# Patient Record
Sex: Female | Born: 1950 | Race: White | Hispanic: No | Smoking: Former smoker
Health system: Southern US, Community
[De-identification: ages and names within clinical notes are randomized; demographics above are authoritative.]

## PROBLEM LIST (undated history)

## (undated) DIAGNOSIS — M810 Age-related osteoporosis without current pathological fracture: Secondary | ICD-10-CM

## (undated) HISTORY — PX: SKIN CANCER EXCISION: SHX779

---

## 1998-08-31 ENCOUNTER — Other Ambulatory Visit: Admission: RE | Admit: 1998-08-31 | Discharge: 1998-08-31 | Payer: Self-pay | Admitting: Obstetrics and Gynecology

## 1999-10-10 ENCOUNTER — Encounter: Payer: Self-pay | Admitting: Obstetrics and Gynecology

## 1999-10-10 ENCOUNTER — Encounter: Admission: RE | Admit: 1999-10-10 | Discharge: 1999-10-10 | Payer: Self-pay | Admitting: Obstetrics and Gynecology

## 1999-12-05 ENCOUNTER — Other Ambulatory Visit: Admission: RE | Admit: 1999-12-05 | Discharge: 1999-12-05 | Payer: Self-pay | Admitting: Obstetrics and Gynecology

## 1999-12-06 ENCOUNTER — Other Ambulatory Visit: Admission: RE | Admit: 1999-12-06 | Discharge: 1999-12-06 | Payer: Self-pay | Admitting: Obstetrics and Gynecology

## 1999-12-06 ENCOUNTER — Encounter (INDEPENDENT_AMBULATORY_CARE_PROVIDER_SITE_OTHER): Payer: Self-pay

## 2000-10-10 ENCOUNTER — Encounter: Payer: Self-pay | Admitting: Obstetrics and Gynecology

## 2000-10-10 ENCOUNTER — Encounter: Admission: RE | Admit: 2000-10-10 | Discharge: 2000-10-10 | Payer: Self-pay | Admitting: Obstetrics and Gynecology

## 2001-01-15 ENCOUNTER — Other Ambulatory Visit: Admission: RE | Admit: 2001-01-15 | Discharge: 2001-01-15 | Payer: Self-pay | Admitting: Obstetrics and Gynecology

## 2001-10-15 ENCOUNTER — Encounter: Payer: Self-pay | Admitting: Obstetrics and Gynecology

## 2001-10-15 ENCOUNTER — Encounter: Admission: RE | Admit: 2001-10-15 | Discharge: 2001-10-15 | Payer: Self-pay | Admitting: Obstetrics and Gynecology

## 2002-01-16 ENCOUNTER — Other Ambulatory Visit: Admission: RE | Admit: 2002-01-16 | Discharge: 2002-01-16 | Payer: Self-pay | Admitting: Obstetrics and Gynecology

## 2002-03-05 ENCOUNTER — Ambulatory Visit (HOSPITAL_COMMUNITY): Admission: RE | Admit: 2002-03-05 | Discharge: 2002-03-05 | Payer: Self-pay | Admitting: Obstetrics and Gynecology

## 2002-03-05 ENCOUNTER — Encounter (INDEPENDENT_AMBULATORY_CARE_PROVIDER_SITE_OTHER): Payer: Self-pay

## 2002-11-05 ENCOUNTER — Ambulatory Visit (HOSPITAL_COMMUNITY): Admission: RE | Admit: 2002-11-05 | Discharge: 2002-11-05 | Payer: Self-pay | Admitting: Obstetrics and Gynecology

## 2002-11-05 ENCOUNTER — Encounter: Payer: Self-pay | Admitting: Obstetrics and Gynecology

## 2002-11-10 ENCOUNTER — Encounter: Admission: RE | Admit: 2002-11-10 | Discharge: 2002-11-10 | Payer: Self-pay | Admitting: Obstetrics and Gynecology

## 2002-11-10 ENCOUNTER — Encounter: Payer: Self-pay | Admitting: Obstetrics and Gynecology

## 2003-11-16 ENCOUNTER — Ambulatory Visit (HOSPITAL_COMMUNITY): Admission: RE | Admit: 2003-11-16 | Discharge: 2003-11-16 | Payer: Self-pay | Admitting: Obstetrics and Gynecology

## 2004-11-29 ENCOUNTER — Ambulatory Visit (HOSPITAL_COMMUNITY): Admission: RE | Admit: 2004-11-29 | Discharge: 2004-11-29 | Payer: Self-pay | Admitting: Obstetrics and Gynecology

## 2005-12-12 ENCOUNTER — Ambulatory Visit (HOSPITAL_COMMUNITY): Admission: RE | Admit: 2005-12-12 | Discharge: 2005-12-12 | Payer: Self-pay | Admitting: Obstetrics and Gynecology

## 2005-12-28 ENCOUNTER — Encounter: Admission: RE | Admit: 2005-12-28 | Discharge: 2005-12-28 | Payer: Self-pay | Admitting: Obstetrics and Gynecology

## 2007-01-16 ENCOUNTER — Ambulatory Visit (HOSPITAL_COMMUNITY): Admission: RE | Admit: 2007-01-16 | Discharge: 2007-01-16 | Payer: Self-pay | Admitting: Internal Medicine

## 2008-01-20 ENCOUNTER — Ambulatory Visit (HOSPITAL_COMMUNITY): Admission: RE | Admit: 2008-01-20 | Discharge: 2008-01-20 | Payer: Self-pay | Admitting: Obstetrics and Gynecology

## 2009-02-16 ENCOUNTER — Ambulatory Visit (HOSPITAL_COMMUNITY): Admission: RE | Admit: 2009-02-16 | Discharge: 2009-02-16 | Payer: Self-pay | Admitting: Internal Medicine

## 2009-03-15 ENCOUNTER — Encounter (INDEPENDENT_AMBULATORY_CARE_PROVIDER_SITE_OTHER): Payer: Self-pay | Admitting: *Deleted

## 2009-05-12 ENCOUNTER — Ambulatory Visit: Payer: Self-pay | Admitting: Internal Medicine

## 2009-10-31 ENCOUNTER — Telehealth: Payer: Self-pay | Admitting: Internal Medicine

## 2010-03-02 ENCOUNTER — Ambulatory Visit (HOSPITAL_COMMUNITY): Admission: RE | Admit: 2010-03-02 | Discharge: 2010-03-02 | Payer: Self-pay | Admitting: Obstetrics and Gynecology

## 2010-10-15 ENCOUNTER — Encounter: Payer: Self-pay | Admitting: Obstetrics and Gynecology

## 2010-10-16 ENCOUNTER — Encounter: Payer: Self-pay | Admitting: Internal Medicine

## 2010-10-24 NOTE — Progress Notes (Signed)
Summary: CHANGED GI CARE FROM   Phone Note Outgoing Call   Call placed by: Hortense Ramal CMA Duncan Dull),  October 31, 2009 12:27 PM Call placed to: Patient Summary of Call: Placed call to patient to advise her that she is due for colonoscopy. She states that she has changed GI care due to insurance coverage. Initial call taken by: Hortense Ramal CMA Duncan Dull),  October 31, 2009 12:28 PM

## 2011-02-09 NOTE — Op Note (Signed)
[facility]  Patient:    Sherry Hawkins, Sherry Hawkins Visit Number: [id number] MRN: [id number]          Service Type: DSU Location: DAY Attending Physician:  Malon Kindle Dictated by:   Malachi Pro. Ambrose Mantle, M.D. Proc. Date: 03/05/02 Admit Date:  03/05/2002 Discharge Date: 03/05/2002                             Operative Report  PREOPERATIVE DIAGNOSIS:  Persistent abnormal uterine bleeding in spite of medical therapy.  POSTOPERATIVE DIAGNOSES:  Persistent abnormal uterine bleeding in spite of medical therapy. Small fibroid and endometrial polyp.  OPERATION PERFORMED:  D&C, hysteroscopy, removal of endometrial polyp.  SURGEON:  Malachi Pro. Ambrose Mantle, M.D.  ANESTHESIA:  General.  DESCRIPTION OF PROCEDURE:  The patient was placed under satisfactory general anesthesia and placed in lithotomy position in the [place] stirrups. The vulva, vagina and perineum were prepped with Betadine solution and draped as a sterile field. Exam revealed the uterus to be anterior normal size. There was a probably 2 cm fibroid in the mid body of the uterus anteriorly. The adnexa felt free. The area was draped as a sterile field. The cervix was drawn into the operative field and the uterus sounded to 8 cm slightly anteriorly. It was then dilated to a #20 Hanks dilator and the hysteroscope was inserted. Because of bleeding from the endometrial cavity, I never could get a decent view of the endometrial cavity. I tried removing the scope 2 or 3 times but never was able to get a good view. I then did an endocervical curettage which reduced minimal tissue and did an endometrial curettage, produced a significant amount of endometrial tissue and then with the polyp forceps I was able to feel the polyp in the right cornual area which I did remove in several pieces. The total volume of it was about a cm2. I then reinserted the hysteroscope, could get a decent view now and saw no polypoid  tissue remaining in the uterus. Overall, the quality of the hysteroscopy visualization was much less than satisfactory. At this point, the procedure was terminated, blood loss was less than 10 cc. The patient returned to recovery in satisfactory condition. Dictated by:   Malachi Pro. Ambrose Mantle, M.D. Attending Physician:  Malon Kindle DD:  03/05/02 TD:  03/07/02 Job: [id number]

## 2011-03-05 ENCOUNTER — Other Ambulatory Visit (HOSPITAL_COMMUNITY): Payer: Self-pay | Admitting: Obstetrics and Gynecology

## 2011-03-05 DIAGNOSIS — Z1231 Encounter for screening mammogram for malignant neoplasm of breast: Secondary | ICD-10-CM

## 2011-03-14 ENCOUNTER — Ambulatory Visit (HOSPITAL_COMMUNITY)
Admission: RE | Admit: 2011-03-14 | Discharge: 2011-03-14 | Disposition: A | Discharge status: Home / Self Care | Payer: BC Managed Care – PPO | Source: Ambulatory Visit | Attending: Obstetrics and Gynecology | Admitting: Obstetrics and Gynecology

## 2011-03-14 DIAGNOSIS — Z1231 Encounter for screening mammogram for malignant neoplasm of breast: Secondary | ICD-10-CM | POA: Insufficient documentation

## 2012-03-25 ENCOUNTER — Other Ambulatory Visit (HOSPITAL_COMMUNITY): Payer: Self-pay | Admitting: Internal Medicine

## 2012-03-25 DIAGNOSIS — Z1231 Encounter for screening mammogram for malignant neoplasm of breast: Secondary | ICD-10-CM

## 2012-04-17 ENCOUNTER — Ambulatory Visit (HOSPITAL_COMMUNITY): Payer: BC Managed Care – PPO

## 2012-05-06 ENCOUNTER — Ambulatory Visit (HOSPITAL_COMMUNITY)
Admission: RE | Admit: 2012-05-06 | Discharge: 2012-05-06 | Disposition: A | Discharge status: Home / Self Care | Payer: BC Managed Care – PPO | Source: Ambulatory Visit | Attending: Internal Medicine | Admitting: Internal Medicine

## 2012-05-06 DIAGNOSIS — Z1231 Encounter for screening mammogram for malignant neoplasm of breast: Secondary | ICD-10-CM | POA: Insufficient documentation

## 2013-02-23 ENCOUNTER — Other Ambulatory Visit: Payer: Self-pay | Admitting: Dermatology

## 2013-04-21 ENCOUNTER — Other Ambulatory Visit (HOSPITAL_COMMUNITY): Payer: Self-pay | Admitting: Internal Medicine

## 2013-04-21 DIAGNOSIS — Z1231 Encounter for screening mammogram for malignant neoplasm of breast: Secondary | ICD-10-CM

## 2013-05-07 ENCOUNTER — Ambulatory Visit (HOSPITAL_COMMUNITY)
Admission: RE | Admit: 2013-05-07 | Discharge: 2013-05-07 | Disposition: A | Discharge status: Home / Self Care | Payer: BC Managed Care – PPO | Source: Ambulatory Visit | Attending: Internal Medicine | Admitting: Internal Medicine

## 2013-05-07 DIAGNOSIS — Z1231 Encounter for screening mammogram for malignant neoplasm of breast: Secondary | ICD-10-CM

## 2013-08-18 ENCOUNTER — Encounter (HOSPITAL_COMMUNITY): Payer: Self-pay

## 2013-08-18 ENCOUNTER — Other Ambulatory Visit (HOSPITAL_COMMUNITY): Payer: Self-pay | Admitting: Internal Medicine

## 2013-08-18 ENCOUNTER — Ambulatory Visit (HOSPITAL_COMMUNITY)
Admission: RE | Admit: 2013-08-18 | Discharge: 2013-08-18 | Disposition: A | Discharge status: Home / Self Care | Payer: BC Managed Care – PPO | Source: Ambulatory Visit | Attending: Internal Medicine | Admitting: Internal Medicine

## 2013-08-18 DIAGNOSIS — M810 Age-related osteoporosis without current pathological fracture: Secondary | ICD-10-CM | POA: Insufficient documentation

## 2013-08-18 HISTORY — DX: Age-related osteoporosis without current pathological fracture: M81.0

## 2013-08-18 MED ORDER — ZOLEDRONIC ACID 5 MG/100ML IV SOLN
5.0000 mg | Freq: Once | INTRAVENOUS | Status: AC
  Administered 2013-08-18: 5 mg via INTRAVENOUS
  Filled 2013-08-18: qty 100

## 2013-08-18 MED ORDER — SODIUM CHLORIDE 0.9 % IV SOLN
Freq: Once | INTRAVENOUS | Status: AC
  Administered 2013-08-18: 15:00:00 via INTRAVENOUS

## 2014-02-24 ENCOUNTER — Other Ambulatory Visit: Payer: Self-pay | Admitting: Dermatology

## 2014-04-26 ENCOUNTER — Other Ambulatory Visit (HOSPITAL_COMMUNITY): Payer: Self-pay | Admitting: Obstetrics and Gynecology

## 2014-04-26 DIAGNOSIS — Z1231 Encounter for screening mammogram for malignant neoplasm of breast: Secondary | ICD-10-CM

## 2014-05-13 ENCOUNTER — Ambulatory Visit (HOSPITAL_COMMUNITY)
Admission: RE | Admit: 2014-05-13 | Discharge: 2014-05-13 | Disposition: A | Discharge status: Home / Self Care | Payer: BC Managed Care – PPO | Source: Ambulatory Visit | Attending: Obstetrics and Gynecology | Admitting: Obstetrics and Gynecology

## 2014-05-13 DIAGNOSIS — Z1231 Encounter for screening mammogram for malignant neoplasm of breast: Secondary | ICD-10-CM | POA: Insufficient documentation

## 2015-05-20 ENCOUNTER — Other Ambulatory Visit (HOSPITAL_COMMUNITY): Payer: Self-pay | Admitting: Obstetrics and Gynecology

## 2015-05-20 DIAGNOSIS — Z1231 Encounter for screening mammogram for malignant neoplasm of breast: Secondary | ICD-10-CM

## 2015-05-26 ENCOUNTER — Ambulatory Visit (HOSPITAL_COMMUNITY): Payer: BC Managed Care – PPO

## 2018-07-28 ENCOUNTER — Other Ambulatory Visit: Payer: Self-pay | Admitting: Obstetrics and Gynecology

## 2018-07-28 DIAGNOSIS — R928 Other abnormal and inconclusive findings on diagnostic imaging of breast: Secondary | ICD-10-CM

## 2018-08-01 ENCOUNTER — Ambulatory Visit
Admission: RE | Admit: 2018-08-01 | Discharge: 2018-08-01 | Disposition: A | Discharge status: Home / Self Care | Payer: BC Managed Care – PPO | Source: Ambulatory Visit | Attending: Obstetrics and Gynecology | Admitting: Obstetrics and Gynecology

## 2018-08-01 ENCOUNTER — Other Ambulatory Visit: Payer: Self-pay | Admitting: Obstetrics and Gynecology

## 2018-08-01 ENCOUNTER — Ambulatory Visit
Admission: RE | Admit: 2018-08-01 | Discharge: 2018-08-01 | Disposition: A | Discharge status: Home / Self Care | Payer: Medicare Other | Source: Ambulatory Visit | Attending: Obstetrics and Gynecology | Admitting: Obstetrics and Gynecology

## 2018-08-01 DIAGNOSIS — R928 Other abnormal and inconclusive findings on diagnostic imaging of breast: Secondary | ICD-10-CM

## 2018-08-01 DIAGNOSIS — N6310 Unspecified lump in the right breast, unspecified quadrant: Secondary | ICD-10-CM

## 2019-01-30 ENCOUNTER — Ambulatory Visit
Admission: RE | Admit: 2019-01-30 | Discharge: 2019-01-30 | Disposition: A | Discharge status: Home / Self Care | Payer: Medicare Other | Source: Ambulatory Visit | Attending: Obstetrics and Gynecology | Admitting: Obstetrics and Gynecology

## 2019-01-30 ENCOUNTER — Other Ambulatory Visit: Payer: Self-pay

## 2019-01-30 DIAGNOSIS — N6310 Unspecified lump in the right breast, unspecified quadrant: Secondary | ICD-10-CM

## 2019-07-24 ENCOUNTER — Other Ambulatory Visit: Payer: Self-pay | Admitting: Obstetrics and Gynecology

## 2019-07-24 DIAGNOSIS — Z1231 Encounter for screening mammogram for malignant neoplasm of breast: Secondary | ICD-10-CM

## 2019-09-14 ENCOUNTER — Ambulatory Visit
Admission: RE | Admit: 2019-09-14 | Discharge: 2019-09-14 | Disposition: A | Discharge status: Home / Self Care | Payer: Medicare Other | Source: Ambulatory Visit | Attending: Obstetrics and Gynecology | Admitting: Obstetrics and Gynecology

## 2019-09-14 ENCOUNTER — Other Ambulatory Visit: Payer: Self-pay

## 2019-09-14 DIAGNOSIS — Z1231 Encounter for screening mammogram for malignant neoplasm of breast: Secondary | ICD-10-CM

## 2020-02-03 DIAGNOSIS — D2271 Melanocytic nevi of right lower limb, including hip: Secondary | ICD-10-CM | POA: Diagnosis not present

## 2020-02-03 DIAGNOSIS — L578 Other skin changes due to chronic exposure to nonionizing radiation: Secondary | ICD-10-CM | POA: Diagnosis not present

## 2020-02-03 DIAGNOSIS — B353 Tinea pedis: Secondary | ICD-10-CM | POA: Diagnosis not present

## 2020-02-03 DIAGNOSIS — D225 Melanocytic nevi of trunk: Secondary | ICD-10-CM | POA: Diagnosis not present

## 2020-02-03 DIAGNOSIS — Z85828 Personal history of other malignant neoplasm of skin: Secondary | ICD-10-CM | POA: Diagnosis not present

## 2020-02-03 DIAGNOSIS — Z87898 Personal history of other specified conditions: Secondary | ICD-10-CM | POA: Diagnosis not present

## 2020-02-03 DIAGNOSIS — L821 Other seborrheic keratosis: Secondary | ICD-10-CM | POA: Diagnosis not present

## 2020-02-03 DIAGNOSIS — D2272 Melanocytic nevi of left lower limb, including hip: Secondary | ICD-10-CM | POA: Diagnosis not present

## 2020-02-03 DIAGNOSIS — Z86018 Personal history of other benign neoplasm: Secondary | ICD-10-CM | POA: Diagnosis not present

## 2020-04-12 DIAGNOSIS — K635 Polyp of colon: Secondary | ICD-10-CM | POA: Diagnosis not present

## 2020-04-12 DIAGNOSIS — Z1211 Encounter for screening for malignant neoplasm of colon: Secondary | ICD-10-CM | POA: Diagnosis not present

## 2020-04-12 DIAGNOSIS — Q438 Other specified congenital malformations of intestine: Secondary | ICD-10-CM | POA: Diagnosis not present

## 2020-04-12 DIAGNOSIS — K648 Other hemorrhoids: Secondary | ICD-10-CM | POA: Diagnosis not present

## 2020-04-14 DIAGNOSIS — K635 Polyp of colon: Secondary | ICD-10-CM | POA: Diagnosis not present

## 2020-07-28 DIAGNOSIS — E785 Hyperlipidemia, unspecified: Secondary | ICD-10-CM | POA: Diagnosis not present

## 2020-07-28 DIAGNOSIS — M810 Age-related osteoporosis without current pathological fracture: Secondary | ICD-10-CM | POA: Diagnosis not present

## 2020-08-04 DIAGNOSIS — Z0000 Encounter for general adult medical examination without abnormal findings: Secondary | ICD-10-CM | POA: Diagnosis not present

## 2020-08-04 DIAGNOSIS — R82998 Other abnormal findings in urine: Secondary | ICD-10-CM | POA: Diagnosis not present

## 2020-08-29 ENCOUNTER — Other Ambulatory Visit: Payer: Self-pay | Admitting: Obstetrics and Gynecology

## 2020-08-29 DIAGNOSIS — Z1231 Encounter for screening mammogram for malignant neoplasm of breast: Secondary | ICD-10-CM

## 2020-09-15 ENCOUNTER — Ambulatory Visit
Admission: RE | Admit: 2020-09-15 | Discharge: 2020-09-15 | Disposition: A | Discharge status: Home / Self Care | Payer: Medicare PPO | Source: Ambulatory Visit | Attending: Obstetrics and Gynecology | Admitting: Obstetrics and Gynecology

## 2020-09-15 ENCOUNTER — Other Ambulatory Visit: Payer: Self-pay

## 2020-09-15 DIAGNOSIS — Z1231 Encounter for screening mammogram for malignant neoplasm of breast: Secondary | ICD-10-CM | POA: Diagnosis not present

## 2020-10-06 ENCOUNTER — Ambulatory Visit: Payer: Medicare Other

## 2021-02-08 DIAGNOSIS — D225 Melanocytic nevi of trunk: Secondary | ICD-10-CM | POA: Diagnosis not present

## 2021-02-08 DIAGNOSIS — D2271 Melanocytic nevi of right lower limb, including hip: Secondary | ICD-10-CM | POA: Diagnosis not present

## 2021-02-08 DIAGNOSIS — D2272 Melanocytic nevi of left lower limb, including hip: Secondary | ICD-10-CM | POA: Diagnosis not present

## 2021-02-08 DIAGNOSIS — L821 Other seborrheic keratosis: Secondary | ICD-10-CM | POA: Diagnosis not present

## 2021-02-08 DIAGNOSIS — L578 Other skin changes due to chronic exposure to nonionizing radiation: Secondary | ICD-10-CM | POA: Diagnosis not present

## 2021-02-08 DIAGNOSIS — Z87898 Personal history of other specified conditions: Secondary | ICD-10-CM | POA: Diagnosis not present

## 2021-02-08 DIAGNOSIS — Z86018 Personal history of other benign neoplasm: Secondary | ICD-10-CM | POA: Diagnosis not present

## 2021-02-08 DIAGNOSIS — L720 Epidermal cyst: Secondary | ICD-10-CM | POA: Diagnosis not present

## 2021-02-08 DIAGNOSIS — Z85828 Personal history of other malignant neoplasm of skin: Secondary | ICD-10-CM | POA: Diagnosis not present

## 2021-03-16 DIAGNOSIS — M810 Age-related osteoporosis without current pathological fracture: Secondary | ICD-10-CM | POA: Diagnosis not present

## 2021-06-26 DIAGNOSIS — M810 Age-related osteoporosis without current pathological fracture: Secondary | ICD-10-CM | POA: Diagnosis not present

## 2021-06-26 DIAGNOSIS — M150 Primary generalized (osteo)arthritis: Secondary | ICD-10-CM | POA: Diagnosis not present

## 2021-06-26 DIAGNOSIS — Z6821 Body mass index (BMI) 21.0-21.9, adult: Secondary | ICD-10-CM | POA: Diagnosis not present

## 2021-08-07 DIAGNOSIS — E785 Hyperlipidemia, unspecified: Secondary | ICD-10-CM | POA: Diagnosis not present

## 2021-08-07 DIAGNOSIS — M810 Age-related osteoporosis without current pathological fracture: Secondary | ICD-10-CM | POA: Diagnosis not present

## 2021-08-14 DIAGNOSIS — Z1331 Encounter for screening for depression: Secondary | ICD-10-CM | POA: Diagnosis not present

## 2021-08-14 DIAGNOSIS — M1990 Unspecified osteoarthritis, unspecified site: Secondary | ICD-10-CM | POA: Diagnosis not present

## 2021-08-14 DIAGNOSIS — Z0000 Encounter for general adult medical examination without abnormal findings: Secondary | ICD-10-CM | POA: Diagnosis not present

## 2021-08-14 DIAGNOSIS — Z1339 Encounter for screening examination for other mental health and behavioral disorders: Secondary | ICD-10-CM | POA: Diagnosis not present

## 2021-08-14 DIAGNOSIS — Z1212 Encounter for screening for malignant neoplasm of rectum: Secondary | ICD-10-CM | POA: Diagnosis not present

## 2021-08-14 DIAGNOSIS — E785 Hyperlipidemia, unspecified: Secondary | ICD-10-CM | POA: Diagnosis not present

## 2021-08-14 DIAGNOSIS — R82998 Other abnormal findings in urine: Secondary | ICD-10-CM | POA: Diagnosis not present

## 2021-08-14 DIAGNOSIS — M810 Age-related osteoporosis without current pathological fracture: Secondary | ICD-10-CM | POA: Diagnosis not present

## 2021-08-16 ENCOUNTER — Other Ambulatory Visit: Payer: Self-pay | Admitting: Obstetrics and Gynecology

## 2021-08-16 DIAGNOSIS — Z1231 Encounter for screening mammogram for malignant neoplasm of breast: Secondary | ICD-10-CM

## 2021-09-07 DIAGNOSIS — Z23 Encounter for immunization: Secondary | ICD-10-CM | POA: Diagnosis not present

## 2021-09-07 DIAGNOSIS — L988 Other specified disorders of the skin and subcutaneous tissue: Secondary | ICD-10-CM | POA: Diagnosis not present

## 2021-09-07 DIAGNOSIS — D485 Neoplasm of uncertain behavior of skin: Secondary | ICD-10-CM | POA: Diagnosis not present

## 2021-09-07 DIAGNOSIS — L739 Follicular disorder, unspecified: Secondary | ICD-10-CM | POA: Diagnosis not present

## 2021-09-07 DIAGNOSIS — L570 Actinic keratosis: Secondary | ICD-10-CM | POA: Diagnosis not present

## 2021-09-21 ENCOUNTER — Ambulatory Visit
Admission: RE | Admit: 2021-09-21 | Discharge: 2021-09-21 | Disposition: A | Discharge status: Home / Self Care | Payer: Medicare PPO | Source: Ambulatory Visit | Attending: Obstetrics and Gynecology | Admitting: Obstetrics and Gynecology

## 2021-09-21 DIAGNOSIS — Z1231 Encounter for screening mammogram for malignant neoplasm of breast: Secondary | ICD-10-CM

## 2021-10-11 DIAGNOSIS — Z124 Encounter for screening for malignant neoplasm of cervix: Secondary | ICD-10-CM | POA: Diagnosis not present

## 2021-10-11 DIAGNOSIS — Z01419 Encounter for gynecological examination (general) (routine) without abnormal findings: Secondary | ICD-10-CM | POA: Diagnosis not present

## 2022-02-14 DIAGNOSIS — D2271 Melanocytic nevi of right lower limb, including hip: Secondary | ICD-10-CM | POA: Diagnosis not present

## 2022-02-14 DIAGNOSIS — Z87898 Personal history of other specified conditions: Secondary | ICD-10-CM | POA: Diagnosis not present

## 2022-02-14 DIAGNOSIS — L821 Other seborrheic keratosis: Secondary | ICD-10-CM | POA: Diagnosis not present

## 2022-02-14 DIAGNOSIS — L578 Other skin changes due to chronic exposure to nonionizing radiation: Secondary | ICD-10-CM | POA: Diagnosis not present

## 2022-02-14 DIAGNOSIS — L739 Follicular disorder, unspecified: Secondary | ICD-10-CM | POA: Diagnosis not present

## 2022-02-14 DIAGNOSIS — Z85828 Personal history of other malignant neoplasm of skin: Secondary | ICD-10-CM | POA: Diagnosis not present

## 2022-02-14 DIAGNOSIS — L570 Actinic keratosis: Secondary | ICD-10-CM | POA: Diagnosis not present

## 2022-02-14 DIAGNOSIS — Z86018 Personal history of other benign neoplasm: Secondary | ICD-10-CM | POA: Diagnosis not present

## 2022-02-14 DIAGNOSIS — D225 Melanocytic nevi of trunk: Secondary | ICD-10-CM | POA: Diagnosis not present

## 2022-08-19 IMAGING — MG MM DIGITAL SCREENING BILAT W/ TOMO AND CAD
6 of 10 series · 6 of 30 positions shown · non-contrast
Comparison: Previous exam(s).

CLINICAL DATA: Screening.

EXAM:
DIGITAL SCREENING BILATERAL MAMMOGRAM WITH TOMOSYNTHESIS AND CAD
TECHNIQUE: Bilateral screening digital craniocaudal and mediolateral oblique
mammograms were obtained. Bilateral screening digital breast
tomosynthesis was performed. The images were evaluated with
computer-aided detection.

[R XCCL synth-2D]
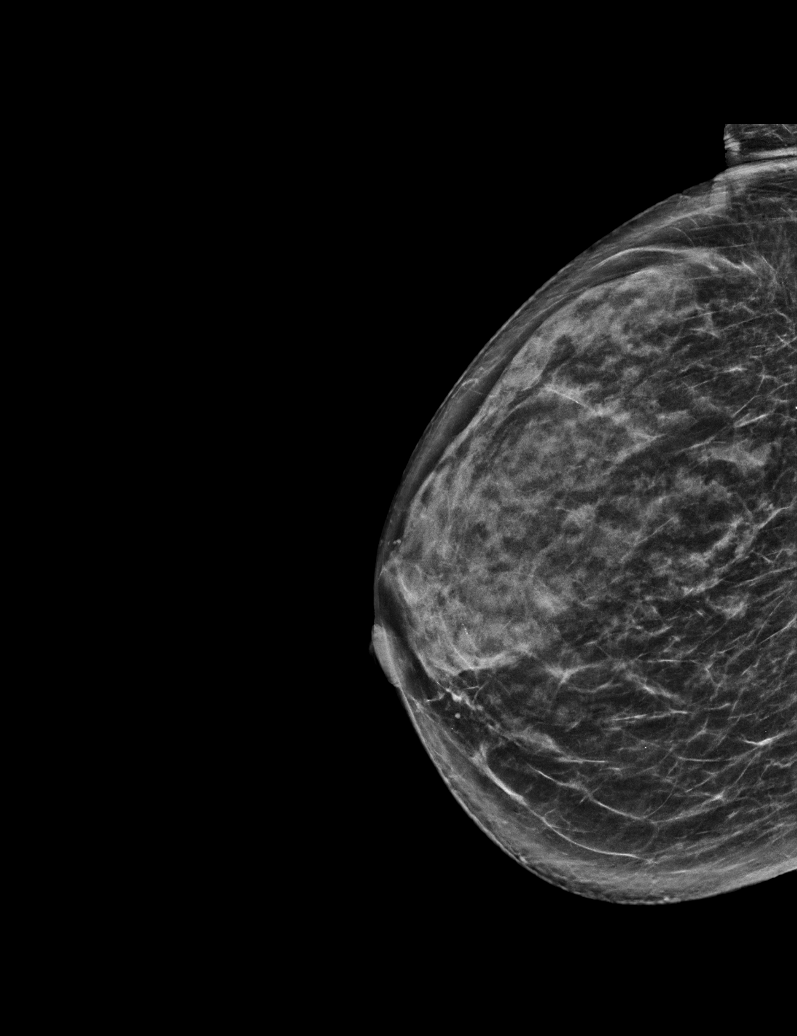

[R MLO synth-2D]
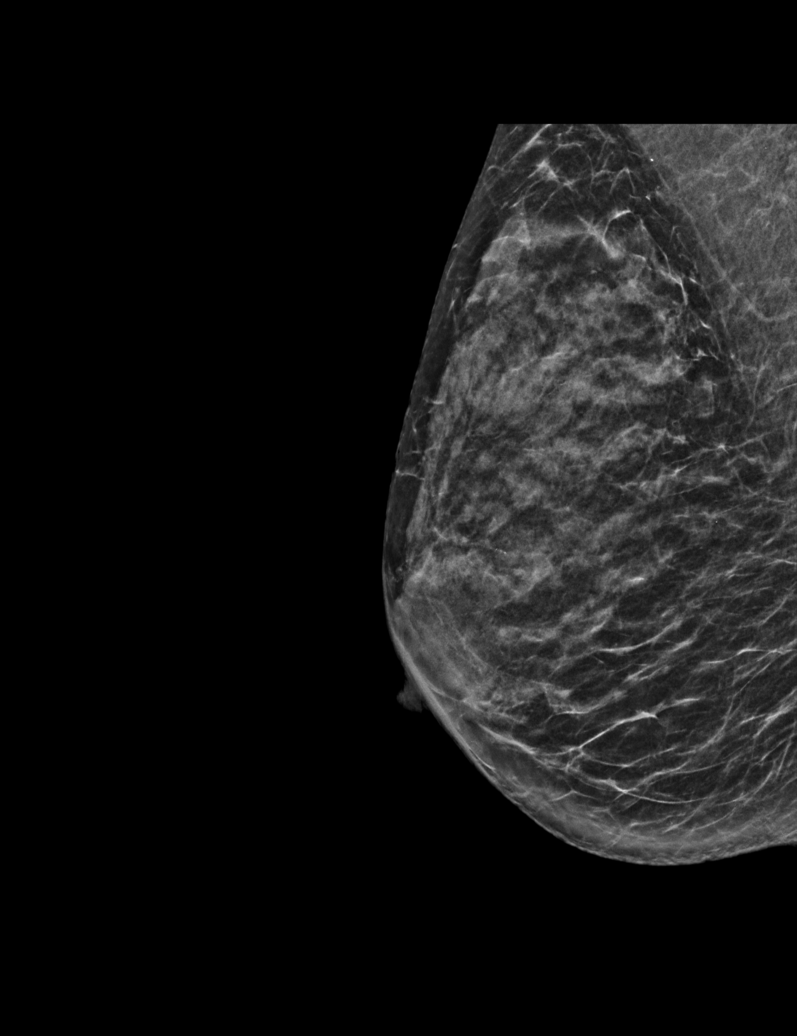

[L MLO synth-2D]
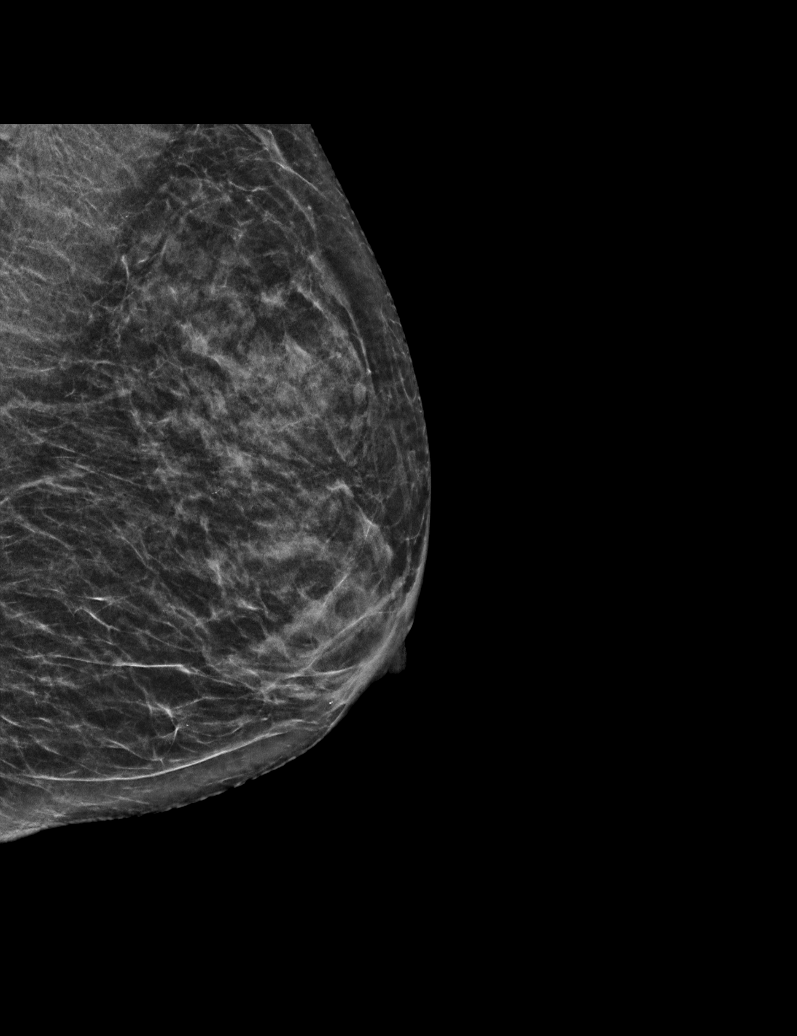

[L CC synth-2D]
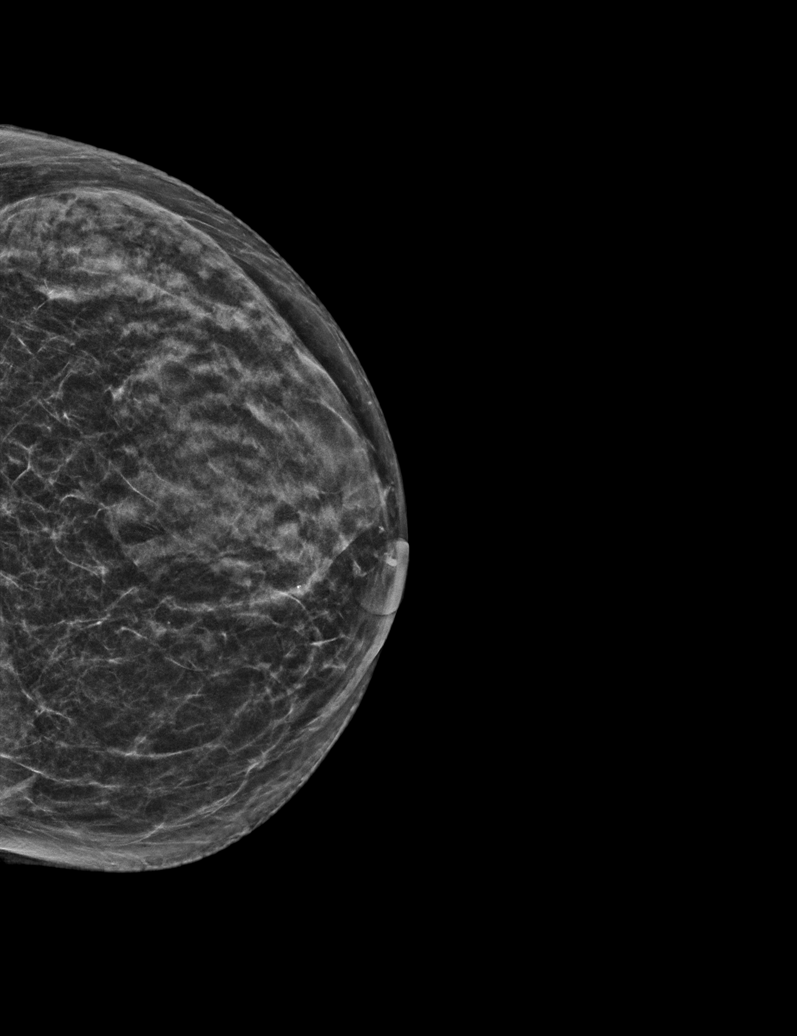

[R CC synth-2D]
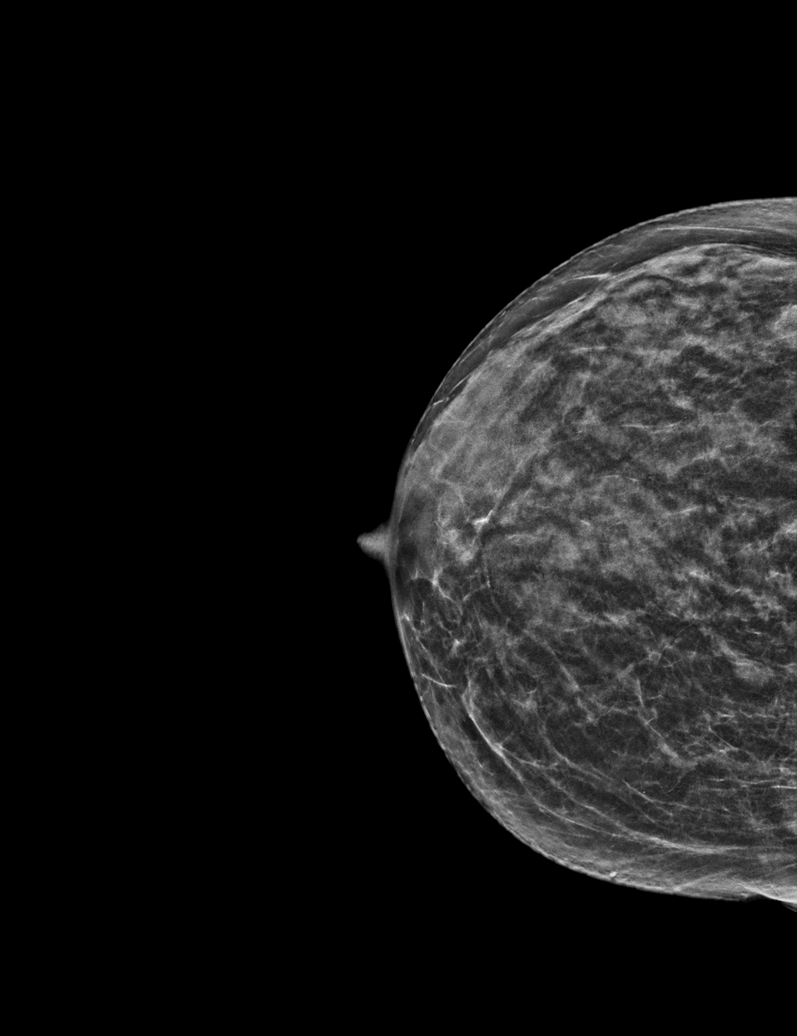

[L MLO tomo · tomo slice 24/47.0]
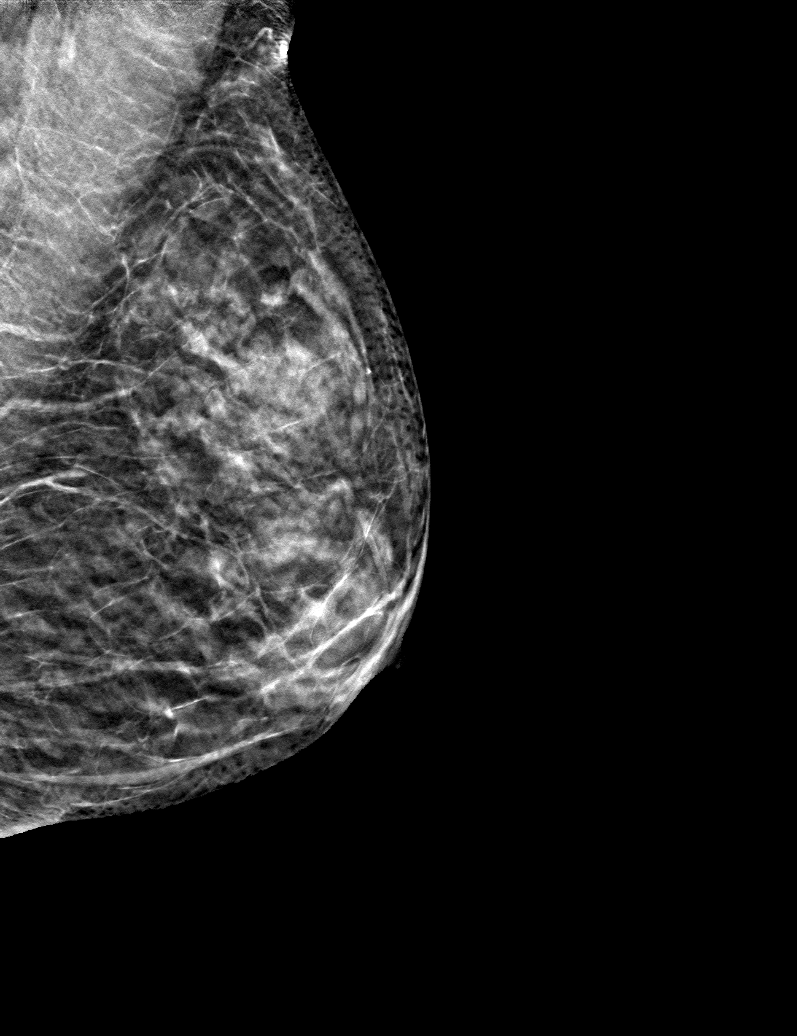

[6 of 30 positions shown; findings below may reference images not displayed]

ACR Breast Density Category c: The breast tissue is heterogeneously
dense, which may obscure small masses.
FINDINGS: There are no findings suspicious for malignancy.
IMPRESSION: No mammographic evidence of malignancy. A result letter of this
screening mammogram will be mailed directly to the patient.

RECOMMENDATION:
Screening mammogram in one year. (Code:Q3-W-BC3)

BI-RADS CATEGORY  1: Negative.

## 2022-08-21 ENCOUNTER — Other Ambulatory Visit: Payer: Self-pay | Admitting: Internal Medicine

## 2022-08-21 DIAGNOSIS — Z1231 Encounter for screening mammogram for malignant neoplasm of breast: Secondary | ICD-10-CM

## 2022-08-22 DIAGNOSIS — R82998 Other abnormal findings in urine: Secondary | ICD-10-CM | POA: Diagnosis not present

## 2022-08-22 DIAGNOSIS — M810 Age-related osteoporosis without current pathological fracture: Secondary | ICD-10-CM | POA: Diagnosis not present

## 2022-08-22 DIAGNOSIS — E785 Hyperlipidemia, unspecified: Secondary | ICD-10-CM | POA: Diagnosis not present

## 2022-08-29 DIAGNOSIS — Z1339 Encounter for screening examination for other mental health and behavioral disorders: Secondary | ICD-10-CM | POA: Diagnosis not present

## 2022-08-29 DIAGNOSIS — R82998 Other abnormal findings in urine: Secondary | ICD-10-CM | POA: Diagnosis not present

## 2022-08-29 DIAGNOSIS — E785 Hyperlipidemia, unspecified: Secondary | ICD-10-CM | POA: Diagnosis not present

## 2022-08-29 DIAGNOSIS — Z0000 Encounter for general adult medical examination without abnormal findings: Secondary | ICD-10-CM | POA: Diagnosis not present

## 2022-08-29 DIAGNOSIS — M1990 Unspecified osteoarthritis, unspecified site: Secondary | ICD-10-CM | POA: Diagnosis not present

## 2022-08-29 DIAGNOSIS — M810 Age-related osteoporosis without current pathological fracture: Secondary | ICD-10-CM | POA: Diagnosis not present

## 2022-08-29 DIAGNOSIS — Z1331 Encounter for screening for depression: Secondary | ICD-10-CM | POA: Diagnosis not present

## 2022-10-17 ENCOUNTER — Ambulatory Visit
Admission: RE | Admit: 2022-10-17 | Discharge: 2022-10-17 | Disposition: A | Discharge status: Home / Self Care | Payer: Medicare PPO | Source: Ambulatory Visit | Attending: Internal Medicine | Admitting: Internal Medicine

## 2022-10-17 DIAGNOSIS — Z1231 Encounter for screening mammogram for malignant neoplasm of breast: Secondary | ICD-10-CM

## 2022-10-18 DIAGNOSIS — N951 Menopausal and female climacteric states: Secondary | ICD-10-CM | POA: Diagnosis not present

## 2022-10-18 DIAGNOSIS — Z01419 Encounter for gynecological examination (general) (routine) without abnormal findings: Secondary | ICD-10-CM | POA: Diagnosis not present

## 2022-11-22 DIAGNOSIS — M79671 Pain in right foot: Secondary | ICD-10-CM | POA: Diagnosis not present

## 2022-11-22 DIAGNOSIS — M2021 Hallux rigidus, right foot: Secondary | ICD-10-CM | POA: Diagnosis not present

## 2023-02-20 DIAGNOSIS — Z85828 Personal history of other malignant neoplasm of skin: Secondary | ICD-10-CM | POA: Diagnosis not present

## 2023-02-20 DIAGNOSIS — D225 Melanocytic nevi of trunk: Secondary | ICD-10-CM | POA: Diagnosis not present

## 2023-02-20 DIAGNOSIS — D2271 Melanocytic nevi of right lower limb, including hip: Secondary | ICD-10-CM | POA: Diagnosis not present

## 2023-02-20 DIAGNOSIS — L821 Other seborrheic keratosis: Secondary | ICD-10-CM | POA: Diagnosis not present

## 2023-02-20 DIAGNOSIS — L578 Other skin changes due to chronic exposure to nonionizing radiation: Secondary | ICD-10-CM | POA: Diagnosis not present

## 2023-02-20 DIAGNOSIS — D485 Neoplasm of uncertain behavior of skin: Secondary | ICD-10-CM | POA: Diagnosis not present

## 2023-02-20 DIAGNOSIS — Z87898 Personal history of other specified conditions: Secondary | ICD-10-CM | POA: Diagnosis not present

## 2023-02-20 DIAGNOSIS — D2272 Melanocytic nevi of left lower limb, including hip: Secondary | ICD-10-CM | POA: Diagnosis not present

## 2023-02-20 DIAGNOSIS — Z86018 Personal history of other benign neoplasm: Secondary | ICD-10-CM | POA: Diagnosis not present

## 2023-05-14 DIAGNOSIS — M810 Age-related osteoporosis without current pathological fracture: Secondary | ICD-10-CM | POA: Diagnosis not present

## 2023-06-11 DIAGNOSIS — D485 Neoplasm of uncertain behavior of skin: Secondary | ICD-10-CM | POA: Diagnosis not present

## 2023-06-11 DIAGNOSIS — L988 Other specified disorders of the skin and subcutaneous tissue: Secondary | ICD-10-CM | POA: Diagnosis not present

## 2023-08-13 DIAGNOSIS — L820 Inflamed seborrheic keratosis: Secondary | ICD-10-CM | POA: Diagnosis not present

## 2023-08-13 DIAGNOSIS — Z189 Retained foreign body fragments, unspecified material: Secondary | ICD-10-CM | POA: Diagnosis not present

## 2023-08-13 DIAGNOSIS — D485 Neoplasm of uncertain behavior of skin: Secondary | ICD-10-CM | POA: Diagnosis not present

## 2023-08-13 DIAGNOSIS — T8189XA Other complications of procedures, not elsewhere classified, initial encounter: Secondary | ICD-10-CM | POA: Diagnosis not present

## 2023-08-28 DIAGNOSIS — M810 Age-related osteoporosis without current pathological fracture: Secondary | ICD-10-CM | POA: Diagnosis not present

## 2023-08-28 DIAGNOSIS — E785 Hyperlipidemia, unspecified: Secondary | ICD-10-CM | POA: Diagnosis not present

## 2023-09-09 DIAGNOSIS — M1990 Unspecified osteoarthritis, unspecified site: Secondary | ICD-10-CM | POA: Diagnosis not present

## 2023-09-09 DIAGNOSIS — Z1331 Encounter for screening for depression: Secondary | ICD-10-CM | POA: Diagnosis not present

## 2023-09-09 DIAGNOSIS — R0981 Nasal congestion: Secondary | ICD-10-CM | POA: Diagnosis not present

## 2023-09-09 DIAGNOSIS — R82998 Other abnormal findings in urine: Secondary | ICD-10-CM | POA: Diagnosis not present

## 2023-09-09 DIAGNOSIS — Z0000 Encounter for general adult medical examination without abnormal findings: Secondary | ICD-10-CM | POA: Diagnosis not present

## 2023-09-09 DIAGNOSIS — E785 Hyperlipidemia, unspecified: Secondary | ICD-10-CM | POA: Diagnosis not present

## 2023-09-09 DIAGNOSIS — M810 Age-related osteoporosis without current pathological fracture: Secondary | ICD-10-CM | POA: Diagnosis not present

## 2023-09-09 DIAGNOSIS — J00 Acute nasopharyngitis [common cold]: Secondary | ICD-10-CM | POA: Diagnosis not present

## 2023-09-09 DIAGNOSIS — Z1339 Encounter for screening examination for other mental health and behavioral disorders: Secondary | ICD-10-CM | POA: Diagnosis not present

## 2023-09-09 DIAGNOSIS — Z1212 Encounter for screening for malignant neoplasm of rectum: Secondary | ICD-10-CM | POA: Diagnosis not present

## 2023-09-12 ENCOUNTER — Other Ambulatory Visit: Payer: Self-pay | Admitting: Internal Medicine

## 2023-09-12 DIAGNOSIS — Z1231 Encounter for screening mammogram for malignant neoplasm of breast: Secondary | ICD-10-CM

## 2023-10-21 ENCOUNTER — Ambulatory Visit
Admission: RE | Admit: 2023-10-21 | Discharge: 2023-10-21 | Disposition: A | Discharge status: Home / Self Care | Payer: Medicare PPO | Source: Ambulatory Visit | Attending: Internal Medicine | Admitting: Internal Medicine

## 2023-10-21 DIAGNOSIS — Z1231 Encounter for screening mammogram for malignant neoplasm of breast: Secondary | ICD-10-CM

## 2023-10-24 DIAGNOSIS — Z01411 Encounter for gynecological examination (general) (routine) with abnormal findings: Secondary | ICD-10-CM | POA: Diagnosis not present

## 2023-10-24 DIAGNOSIS — N951 Menopausal and female climacteric states: Secondary | ICD-10-CM | POA: Diagnosis not present

## 2023-10-24 DIAGNOSIS — Z01419 Encounter for gynecological examination (general) (routine) without abnormal findings: Secondary | ICD-10-CM | POA: Diagnosis not present

## 2024-03-02 DIAGNOSIS — H9191 Unspecified hearing loss, right ear: Secondary | ICD-10-CM | POA: Diagnosis not present

## 2024-03-02 DIAGNOSIS — H6991 Unspecified Eustachian tube disorder, right ear: Secondary | ICD-10-CM | POA: Diagnosis not present

## 2024-03-02 DIAGNOSIS — J00 Acute nasopharyngitis [common cold]: Secondary | ICD-10-CM | POA: Diagnosis not present

## 2024-03-04 DIAGNOSIS — L720 Epidermal cyst: Secondary | ICD-10-CM | POA: Diagnosis not present

## 2024-03-04 DIAGNOSIS — D2271 Melanocytic nevi of right lower limb, including hip: Secondary | ICD-10-CM | POA: Diagnosis not present

## 2024-03-04 DIAGNOSIS — D225 Melanocytic nevi of trunk: Secondary | ICD-10-CM | POA: Diagnosis not present

## 2024-03-04 DIAGNOSIS — L821 Other seborrheic keratosis: Secondary | ICD-10-CM | POA: Diagnosis not present

## 2024-03-04 DIAGNOSIS — Z85828 Personal history of other malignant neoplasm of skin: Secondary | ICD-10-CM | POA: Diagnosis not present

## 2024-03-04 DIAGNOSIS — L578 Other skin changes due to chronic exposure to nonionizing radiation: Secondary | ICD-10-CM | POA: Diagnosis not present

## 2024-03-04 DIAGNOSIS — Z86018 Personal history of other benign neoplasm: Secondary | ICD-10-CM | POA: Diagnosis not present

## 2024-03-04 DIAGNOSIS — D2272 Melanocytic nevi of left lower limb, including hip: Secondary | ICD-10-CM | POA: Diagnosis not present

## 2024-03-04 DIAGNOSIS — Z87898 Personal history of other specified conditions: Secondary | ICD-10-CM | POA: Diagnosis not present

## 2024-03-13 DIAGNOSIS — H9191 Unspecified hearing loss, right ear: Secondary | ICD-10-CM | POA: Diagnosis not present

## 2024-03-13 DIAGNOSIS — H6991 Unspecified Eustachian tube disorder, right ear: Secondary | ICD-10-CM | POA: Diagnosis not present

## 2024-03-13 DIAGNOSIS — H9311 Tinnitus, right ear: Secondary | ICD-10-CM | POA: Diagnosis not present

## 2024-03-24 DIAGNOSIS — H9311 Tinnitus, right ear: Secondary | ICD-10-CM | POA: Diagnosis not present

## 2024-03-24 DIAGNOSIS — H6991 Unspecified Eustachian tube disorder, right ear: Secondary | ICD-10-CM | POA: Diagnosis not present

## 2024-09-29 ENCOUNTER — Other Ambulatory Visit: Payer: Self-pay | Admitting: Obstetrics and Gynecology

## 2024-09-29 DIAGNOSIS — Z1231 Encounter for screening mammogram for malignant neoplasm of breast: Secondary | ICD-10-CM

## 2024-10-22 ENCOUNTER — Ambulatory Visit
Admission: RE | Admit: 2024-10-22 | Discharge: 2024-10-22 | Disposition: A | Discharge status: Home / Self Care | Source: Ambulatory Visit | Attending: Obstetrics and Gynecology | Admitting: Obstetrics and Gynecology

## 2024-10-22 DIAGNOSIS — Z1231 Encounter for screening mammogram for malignant neoplasm of breast: Secondary | ICD-10-CM
